# Patient Record
Sex: Male | Born: 2000 | Race: Black or African American | Hispanic: No | Marital: Single | Smoking: Never smoker
Health system: Southern US, Community
[De-identification: ages and names within clinical notes are randomized; demographics above are authoritative.]

## PROBLEM LIST (undated history)

## (undated) DIAGNOSIS — Q211 Atrial septal defect, unspecified: Secondary | ICD-10-CM

## (undated) DIAGNOSIS — J302 Other seasonal allergic rhinitis: Secondary | ICD-10-CM

## (undated) HISTORY — PX: EYE SURGERY: SHX253

## (undated) HISTORY — PX: CARDIAC SURGERY: SHX584

---
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
## 2022-01-02 DIAGNOSIS — M79641 Pain in right hand: Secondary | ICD-10-CM | POA: Diagnosis not present

## 2022-01-18 DIAGNOSIS — M79641 Pain in right hand: Secondary | ICD-10-CM | POA: Diagnosis not present

## 2022-02-13 DIAGNOSIS — M79641 Pain in right hand: Secondary | ICD-10-CM | POA: Diagnosis not present

## 2022-02-23 ENCOUNTER — Encounter: Payer: Self-pay | Admitting: Family Medicine

## 2022-02-23 ENCOUNTER — Ambulatory Visit (INDEPENDENT_AMBULATORY_CARE_PROVIDER_SITE_OTHER): Payer: Medicaid Other | Admitting: Family Medicine

## 2022-02-23 VITALS — BP 120/74 | HR 82 | Wt 154.0 lb

## 2022-02-23 DIAGNOSIS — G479 Sleep disorder, unspecified: Secondary | ICD-10-CM | POA: Diagnosis not present

## 2022-02-23 DIAGNOSIS — Z7689 Persons encountering health services in other specified circumstances: Secondary | ICD-10-CM | POA: Diagnosis not present

## 2022-02-23 NOTE — Progress Notes (Signed)
    SUBJECTIVE:   CHIEF COMPLAINT / HPI:   Patient same day walk in- states he has been having trouble falling asleep. Goes to sleep at 10:30pm and wakes up at 4am. Has issues falling asleep as well as going back to sleep once he wakes up. Typically wakes up at 12 in the afternoon and goes to bed at 10/11pm. Denies taking a nap. When he goes to sleep at 8pm then will wake up at 9pm. When he wakes up at 4am will get water and try to eat something but can't go back to sleep. Will look at his phone for 30 min. Has tried white noise and it doesn't help.   PERTINENT  PMH / PSH: Reviewed   OBJECTIVE:   BP 120/74   Pulse 82   Wt 154 lb (69.9 kg)   SpO2 96%   BMI 25.63 kg/m    Physical exam General: well appearing, NAD Cardiovascular: RRR, no murmurs Lungs: CTAB. Normal WOB Abdomen: soft, non-distended, non-tender Skin: warm, dry. No edema  ASSESSMENT/PLAN:   Sleep concern Patient presents to discuss difficulty falling asleep and staying asleep.  Typically goes to bed at 10:30 PM and will wake up at 4 AM.  States when he does wake up at 4 AM not to eat, look at phone, etc. in order to train body to fall right back asleep.  Discussed other sleep hygiene tips such as avoiding screen time prior to bedtime, not taking naps, avoiding caffeine after noon, etc. Recommended trying melatonin if needed.    [place]

## 2022-02-23 NOTE — Patient Instructions (Addendum)
It was great seeing you today!  We talked about your sleep and when you do wake up avoid using your phone, eating etc. To try and get your body back to sleep. Keep a log of when you are waking up early.   Avoid caffeine after 12 in the afternoon, avoid alcohol and screen use an hour before bed.   You can try melatonin over the counter as well for sleep.   Keep a log of the time you are going to bed and waking up.   Feel free to call with any questions or concerns at any time, at [phone].   Take care,  Dr. Shary [facility]    Healthy Living: Sleep Tips Sleep is an important part of a healthy lifestyle. In this video, you will learn about things you can do to help get quality sleep. To view the content, go to this web address: [email or web address]  This video will expire on: 09/21/2023. If you need access to this video following this date, please reach out to the healthcare provider who assigned it to you. This information is not intended to replace advice given to you by your health care provider. Make sure you discuss any questions you have with your health care provider. Elsevier Patient Education  [employer].

## 2022-02-24 DIAGNOSIS — Z7689 Persons encountering health services in other specified circumstances: Secondary | ICD-10-CM | POA: Insufficient documentation

## 2022-02-24 NOTE — Assessment & Plan Note (Signed)
Patient presents to discuss difficulty falling asleep and staying asleep.  Typically goes to bed at 10:30 PM and will wake up at 4 AM.  States when he does wake up at 4 AM not to eat, look at phone, etc. in order to train body to fall right back asleep.  Discussed other sleep hygiene tips such as avoiding screen time prior to bedtime, not taking naps, avoiding caffeine after noon, etc. Recommended trying melatonin if needed.

## 2022-03-09 ENCOUNTER — Other Ambulatory Visit: Payer: Self-pay | Admitting: Student

## 2022-03-31 DIAGNOSIS — G4489 Other headache syndrome: Secondary | ICD-10-CM | POA: Diagnosis not present

## 2022-03-31 DIAGNOSIS — R079 Chest pain, unspecified: Secondary | ICD-10-CM | POA: Diagnosis not present

## 2022-03-31 DIAGNOSIS — R0789 Other chest pain: Secondary | ICD-10-CM | POA: Diagnosis not present

## 2022-04-12 ENCOUNTER — Other Ambulatory Visit: Payer: Self-pay | Admitting: Student

## 2022-04-13 ENCOUNTER — Other Ambulatory Visit: Payer: Self-pay | Admitting: *Deleted

## 2022-04-13 DIAGNOSIS — L442 Lichen striatus: Secondary | ICD-10-CM

## 2022-04-13 MED ORDER — TRIAMCINOLONE ACETONIDE 0.5 % EX OINT: 1.0000 | TOPICAL_OINTMENT | Freq: Two times a day (BID) | CUTANEOUS | 3 refills | Status: AC

## 2022-06-06 ENCOUNTER — Ambulatory Visit (INDEPENDENT_AMBULATORY_CARE_PROVIDER_SITE_OTHER): Payer: 59 | Admitting: Family Medicine

## 2022-06-06 VITALS — BP 119/67 | HR 66 | Ht 65.0 in | Wt 150.5 lb

## 2022-06-06 DIAGNOSIS — Z23 Encounter for immunization: Secondary | ICD-10-CM | POA: Diagnosis not present

## 2022-06-06 DIAGNOSIS — R21 Rash and other nonspecific skin eruption: Secondary | ICD-10-CM

## 2022-06-06 NOTE — Patient Instructions (Signed)
It was great seeing you today!  Today we discussed your rash, this is due to lichen sclerosis which is thickening of the skin. This is not an infectious process so it would be appropriate for you to donate plasma.  I have completed the form as well describing this.   Please follow up at your next scheduled appointment, if anything arises between now and then, please don't hesitate to contact our office.   Thank you for allowing [name] to be a part of your medical care!  Thank you, Dr. Robyne Peers

## 2022-06-06 NOTE — Assessment & Plan Note (Signed)
-  has seen dermatology, s/p biopsy and noted to have lichen sclerosis. Has remained stable over the years. It is appropriate for patient to donate blood as this does not involve an infectious process. Patient is otherwise healthy.  -form completed to allow plasma donation -follow up as appropriate

## 2022-06-06 NOTE — Progress Notes (Signed)
    SUBJECTIVE:   CHIEF COMPLAINT / HPI:   Patient presents with concern of a rash that he has had for years. He is concerned that when he went to go donate plasma, they needed the doctor's permission prior to donating in the future. Denies fever or chills. He says that in the past he was referred to dermatology and they did a biopsy.  He reports that he uses the clobatesol as prescribed and the rash has not worsened or changed in years.   OBJECTIVE:   BP (!) 122/49   Pulse 66   Ht 5\' 5"  (1.651 m)   Wt 150 lb 8 oz (68.3 kg)   SpO2 97%   BMI 25.04 kg/m   General: Patient well-appearing, in no acute distress. Resp: normal work of breathing noted Derm: thickened, hyperpigmented area of the skin along the dorsal aspect of UE bilaterally without erythema or edema noted       ASSESSMENT/PLAN:   Rash -has seen dermatology, s/p biopsy and noted to have lichen sclerosis. Has remained stable over the years. It is appropriate for patient to donate blood as this does not involve an infectious process. Patient is otherwise healthy.  -form completed to allow plasma donation -follow up as appropriate      Merton Wadlow Robyne Peers, DO [facility]

## 2022-07-21 ENCOUNTER — Encounter (HOSPITAL_COMMUNITY): Payer: Self-pay | Admitting: Emergency Medicine

## 2022-07-21 ENCOUNTER — Ambulatory Visit (HOSPITAL_COMMUNITY)
Admission: EM | Admit: 2022-07-21 | Discharge: 2022-07-21 | Disposition: A | Discharge status: Home / Self Care | Payer: 59 | Attending: Internal Medicine | Admitting: Internal Medicine

## 2022-07-21 DIAGNOSIS — E860 Dehydration: Secondary | ICD-10-CM | POA: Diagnosis not present

## 2022-07-21 DIAGNOSIS — R252 Cramp and spasm: Secondary | ICD-10-CM | POA: Diagnosis not present

## 2022-07-21 MED ORDER — IBUPROFEN 800 MG PO TABS: 800.0000 mg | ORAL_TABLET | Freq: Three times a day (TID) | ORAL | 0 refills | Status: DC

## 2022-07-21 MED ORDER — IBUPROFEN 800 MG PO TABS
800.0000 mg | ORAL_TABLET | Freq: Once | ORAL | Status: AC
  Administered 2022-07-21: 800 mg via ORAL

## 2022-07-21 MED ORDER — IBUPROFEN 800 MG PO TABS
ORAL_TABLET | ORAL | Status: AC
  Filled 2022-07-21: qty 1

## 2022-07-21 NOTE — Discharge Instructions (Signed)
Your leg cramping is likely due to dehydration.  Please increase her water intake to at least 6 to 8 cups of water per day (64 ounces of water per day).  Ibuprofen as needed for pain.  If you develop any new or worsening symptoms or do not improve in the next 2 to 3 days, please return.  If your symptoms are severe, please go to the emergency room.  Follow-up with your primary care provider for further evaluation and management of your symptoms as well as ongoing wellness visits.  I hope you feel better!

## 2022-07-21 NOTE — ED Triage Notes (Signed)
Pt reports that he was walking down town yesterday and got bilateral leg cramps. Reports that "next thing I know I am being arrested and the police pushed me on the ground". Pt c/o bilateral leg pains but worse on left leg.
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
## 2022-09-02 ENCOUNTER — Emergency Department (HOSPITAL_COMMUNITY): Payer: 59

## 2022-09-02 ENCOUNTER — Emergency Department (HOSPITAL_COMMUNITY)
Admission: EM | Admit: 2022-09-02 | Discharge: 2022-09-02 | Disposition: A | Discharge status: Home / Self Care | Payer: 59 | Attending: Emergency Medicine | Admitting: Emergency Medicine

## 2022-09-02 ENCOUNTER — Other Ambulatory Visit: Payer: Self-pay

## 2022-09-02 ENCOUNTER — Encounter (HOSPITAL_COMMUNITY): Payer: Self-pay

## 2022-09-02 DIAGNOSIS — R0789 Other chest pain: Secondary | ICD-10-CM | POA: Diagnosis not present

## 2022-09-02 DIAGNOSIS — I10 Essential (primary) hypertension: Secondary | ICD-10-CM | POA: Diagnosis not present

## 2022-09-02 DIAGNOSIS — R0781 Pleurodynia: Secondary | ICD-10-CM | POA: Insufficient documentation

## 2022-09-02 DIAGNOSIS — R079 Chest pain, unspecified: Secondary | ICD-10-CM | POA: Diagnosis not present

## 2022-09-02 LAB — CBC
HCT: 51.4 % (ref 39.0–52.0)
Hemoglobin: 17.2 g/dL — ABNORMAL HIGH (ref 13.0–17.0)
MCH: 31.3 pg (ref 26.0–34.0)
MCHC: 33.5 g/dL (ref 30.0–36.0)
MCV: 93.6 fL (ref 80.0–100.0)
Platelets: 344 10*3/uL (ref 150–400)
RBC: 5.49 MIL/uL (ref 4.22–5.81)
RDW: 12.7 % (ref 11.5–15.5)
WBC: 12.9 10*3/uL — ABNORMAL HIGH (ref 4.0–10.5)
nRBC: 0 % (ref 0.0–0.2)

## 2022-09-02 LAB — BASIC METABOLIC PANEL
Anion gap: 6 (ref 5–15)
BUN: 13 mg/dL (ref 6–20)
CO2: 28 mmol/L (ref 22–32)
Calcium: 8.5 mg/dL — ABNORMAL LOW (ref 8.9–10.3)
Chloride: 105 mmol/L (ref 98–111)
Creatinine, Ser: 1.46 mg/dL — ABNORMAL HIGH (ref 0.61–1.24)
GFR, Estimated: >60 mL/min (ref >60)
Glucose, Bld: 81 mg/dL (ref 70–99)
Potassium: 4 mmol/L (ref 3.5–5.1)
Sodium: 139 mmol/L (ref 135–145)

## 2022-09-02 LAB — TROPONIN I (HIGH SENSITIVITY)
Troponin I (High Sensitivity): 6 ng/L (ref <18)
Troponin I (High Sensitivity): 5 ng/L (ref <18)

## 2022-09-02 MED ORDER — METHOCARBAMOL 500 MG PO TABS
1000.0000 mg | ORAL_TABLET | Freq: Once | ORAL | Status: AC
  Administered 2022-09-02: 1000 mg via ORAL
  Filled 2022-09-02: qty 2

## 2022-09-02 MED ORDER — CYCLOBENZAPRINE HCL 10 MG PO TABS: 10.0000 mg | ORAL_TABLET | Freq: Two times a day (BID) | ORAL | 0 refills | Status: DC | PRN

## 2022-09-02 NOTE — ED Provider Notes (Signed)
[facility] Provider Note   CSN: [id number] Arrival date & time: 09/02/22  0243     History  Chief Complaint  Patient presents with   Chest Pain    Edwin Macdonald is a 22 y.o. male.  HPI   Patient with medical history including ASD status post patching, presenting with complaints of sternal chest pain, states it woke him up around 1 AM, states it is constant, describes a pressure-like sensation, he has no associated shortness of breath, pleuritic chest pain, does not radiate, there is no alleviating or aggravating factors, denies any stomach pain, denies any recent surgeries long immobilizations, no history of PEs or DVTs.  Patient states he has had this before but resolved on its own.    Home Medications Prior to Admission medications   Medication Sig Start Date End Date Taking? Authorizing Provider  cyclobenzaprine (FLEXERIL) 10 MG tablet Take 1 tablet (10 mg total) by mouth 2 (two) times daily as needed for muscle spasms. 09/02/22  Yes Carroll Sage, PA-C  cetirizine (ZYRTEC) 10 MG tablet TAKE 1 TABLET(10 MG) BY MOUTH DAILY 04/13/22   Idalia Needle, Turkey J, DO  famotidine (PEPCID) 20 MG tablet TAKE 1 TABLET BY MOUTH 2 TIMES A DAY 08/04/19   Dana Allan, MD  fluticasone [facility]) 50 MCG/ACT nasal spray SHAKE LIQUID AND USE 1 SPRAY IN EACH NOSTRIL DAILY 04/13/22   Idalia Needle, Lucas Mallow, DO  ibuprofen (ADVIL) 800 MG tablet Take 1 tablet (800 mg total) by mouth 3 (three) times daily. 07/21/22   Carlisle Beers, FNP  montelukast (SINGULAIR) 10 MG tablet TAKE 1 TABLET(10 MG) BY MOUTH AT BEDTIME 03/10/22   Maury Dus, MD  ranitidine (ZANTAC) 150 MG capsule Take 1 capsule (150 mg total) by mouth daily. 02/16/16   Niel Hummer, MD  triamcinolone ointment (KENALOG) 0.5 % Apply 1 Application topically 2 (two) times daily. For moderate to severe eczema.  Do not use for more than 1 week at a time. 04/13/22   Cora Collum, DO      Allergies     Patient has no known allergies.    Review of Systems   Review of Systems  Constitutional:  Negative for chills and fever.  Respiratory:  Negative for shortness of breath.   Cardiovascular:  Positive for chest pain.  Gastrointestinal:  Negative for abdominal pain.  Neurological:  Negative for headaches.    Physical Exam Updated Vital Signs BP 113/76   Pulse (!) 58   Temp 98 F (36.7 C) (Oral)   Resp 18   Ht 5\' 9"  (1.753 m)   Wt 68 kg   SpO2 99%   BMI 22.15 kg/m  Physical Exam Vitals and nursing note reviewed.  Constitutional:      General: He is not in acute distress.    Appearance: He is not ill-appearing.  HENT:     Head: Normocephalic and atraumatic.     Nose: No congestion.  Eyes:     Conjunctiva/sclera: Conjunctivae normal.  Cardiovascular:     Rate and Rhythm: Normal rate and regular rhythm.     Pulses: Normal pulses.     Heart sounds: No murmur heard.    No friction rub. No gallop.  Pulmonary:     Effort: No respiratory distress.     Breath sounds: No wheezing, rhonchi or rales.     Comments:  notable surgical scar along patient's sternum, chest was palpated he has focalized and reproducible chest pain along  his sternum, lung sounds are clear bilaterally. Abdominal:     Palpations: Abdomen is soft.     Tenderness: There is no abdominal tenderness. There is no right CVA tenderness or left CVA tenderness.  Musculoskeletal:     Right lower leg: No edema.     Left lower leg: No edema.     Comments: No unilateral leg swelling no calf tenderness no palpable cords.  Skin:    General: Skin is warm and dry.  Neurological:     Mental Status: He is alert.  Psychiatric:        Mood and Affect: Mood normal.     ED Results / Procedures / Treatments   Labs (all labs ordered are listed, but only abnormal results are displayed) Labs Reviewed  BASIC METABOLIC PANEL - Abnormal; Notable for the following components:      Result Value   Creatinine, Ser 1.46 (*)     Calcium 8.5 (*)    All other components within normal limits  CBC - Abnormal; Notable for the following components:   WBC 12.9 (*)    Hemoglobin 17.2 (*)    All other components within normal limits  TROPONIN I (HIGH SENSITIVITY)  TROPONIN I (HIGH SENSITIVITY)    EKG EKG Interpretation Date/Time:  Saturday September 02 2022 02:56:38 EDT Ventricular Rate:  72 PR Interval:  141 QRS Duration:  77 QT Interval:  350 QTC Calculation: 383 R Axis:   102  Text Interpretation: Sinus rhythm Consider right ventricular hypertrophy When compared with ECG of 01/18/2021, No significant change was found Confirmed by Dione Booze ([id number]) on 09/02/2022 3:05:38 AM  Radiology DG Chest 2 View  Result Date: 09/02/2022 CLINICAL DATA:  Chest pain EXAM: CHEST - 2 VIEW COMPARISON:  Radiograph 01/18/2021 FINDINGS: Sternotomy. The heart size and mediastinal contours are within normal limits. Both lungs are clear. The visualized skeletal structures are unremarkable. IMPRESSION: No active cardiopulmonary disease. Electronically Signed   By: Minerva Fester M.D.   On: 09/02/2022 03:25    Procedures Procedures    Medications Ordered in ED Medications  methocarbamol (ROBAXIN) tablet 1,000 mg (1,000 mg Oral Given 09/02/22 0536)    ED Course/ Medical Decision Making/ A&P                                 Medical Decision Making Amount and/or Complexity of Data Reviewed Labs: ordered. Radiology: ordered.  Risk Prescription drug management.   This patient presents to the ED for concern of chest pain, this involves an extensive number of treatment options, and is a complaint that carries with it a high risk of complications and morbidity.  The differential diagnosis includes PE, ACS, pneumonia, dissection,    Additional history obtained:  Additional history obtained from N/A External records from outside source obtained and reviewed including etiology notes   Co morbidities that complicate the patient  evaluation  ASD  Social Determinants of Health:  N/A    Lab Tests:  I Ordered, and personally interpreted labs.  The pertinent results include: CBC shows leukocytosis 12.9, BMP reveals creatinine 1.46, calcium 8.5, negative delta troponin   Imaging Studies ordered:  I ordered imaging studies including chest x-ray I independently visualized and interpreted imaging which showed unremarkable I agree with the radiologist interpretation   Cardiac Monitoring:  The patient was maintained on a cardiac monitor.  I personally viewed and interpreted the cardiac monitored which showed an underlying rhythm  of: EKG that signs of ischemia   Medicines ordered and prescription drug management:  I ordered medication including Robaxin I have reviewed the patients home medicines and have made adjustments as needed  Critical Interventions:  N/A  Reevaluation:  Presents with chest pain will obtain chest pain workup, continue to monitor  Updated on lab workup and imaging, he is in agreement with discharge at this time.  Consultations Obtained:  N/a    Test Considered:  N/a    Rule out I have low suspicion for ACS as history is atypical, EKG was sinus rhythm without signs of ischemia, patient had a negative troponin.  Low suspicion for PE as patient denies pleuritic chest pain, shortness of breath, patient denies leg pain, no pedal edema noted on exam, patient was PERC negative.  Low suspicion for AAA or aortic dissection as history is atypical, patient has low risk factors.  Low suspicion for systemic infection as patient is nontoxic-appearing, vital signs reassuring, no obvious source infection noted on exam.     Dispostion and problem list  After consideration of the diagnostic results and the patients response to treatment, I feel that the patent would benefit from discharge.  Sternal chest pain-suspect this is muscular in nature, will provide muscle relaxers, recommend  over-the-counter pain medications, follow-up with cardiology as needed.            Final Clinical Impression(s) / ED Diagnoses Final diagnoses:  Chest pain, mid sternal    Rx / DC Orders ED Discharge Orders          Ordered    cyclobenzaprine (FLEXERIL) 10 MG tablet  2 times daily PRN        09/02/22 6578    Ambulatory referral to Cardiology       Comments: If you have not heard from the Cardiology office within the next 72 hours please call [phone].   09/02/22 0638              Carroll Sage, PA-C 09/02/22 1324    Dione Booze, MD 09/02/22 [phone]

## 2022-09-02 NOTE — Discharge Instructions (Addendum)
Lab workup and imaging are reassuring, recommend over-the-counter pain medications.  Given you a muscle laxer take as prescribed.  May follow-up with cardiology and/or PCP.  Come back to the emergency department if you develop chest pain, shortness of breath, severe abdominal pain, uncontrolled nausea, vomiting, diarrhea.
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
## 2022-09-12 ENCOUNTER — Ambulatory Visit: Payer: 59 | Admitting: Family Medicine

## 2022-11-20 ENCOUNTER — Ambulatory Visit (INDEPENDENT_AMBULATORY_CARE_PROVIDER_SITE_OTHER): Payer: 59 | Admitting: Family Medicine

## 2022-11-20 ENCOUNTER — Encounter: Payer: Self-pay | Admitting: Family Medicine

## 2022-11-20 VITALS — BP 110/60 | HR 65 | Ht 65.0 in | Wt 148.5 lb

## 2022-11-20 DIAGNOSIS — D751 Secondary polycythemia: Secondary | ICD-10-CM

## 2022-11-20 DIAGNOSIS — R7989 Other specified abnormal findings of blood chemistry: Secondary | ICD-10-CM | POA: Diagnosis not present

## 2022-11-20 DIAGNOSIS — Z114 Encounter for screening for human immunodeficiency virus [HIV]: Secondary | ICD-10-CM | POA: Diagnosis not present

## 2022-11-20 DIAGNOSIS — J302 Other seasonal allergic rhinitis: Secondary | ICD-10-CM

## 2022-11-20 DIAGNOSIS — Z1159 Encounter for screening for other viral diseases: Secondary | ICD-10-CM | POA: Diagnosis not present

## 2022-11-20 NOTE — Assessment & Plan Note (Signed)
Home medications include zyrtec, Singulair and flonase. Does not need any refills at this time.  -Follow up as needed

## 2022-11-20 NOTE — Assessment & Plan Note (Signed)
Patient was seen in ED recently for chest pain which was MSK in nature. Incidental elevation in Creatinine noted.  - BMP scheduled for f/u Cr

## 2022-11-20 NOTE — Patient Instructions (Addendum)
It was wonderful to see you today.  Please bring ALL of your medications with you to every visit.   Today we talked about:  Your blood pressure. Your Blood pressure looks great today! If you start to experience symptoms and would like to have it checked again, please schedule a visit. Please keep a log of your blood pressure if you feel sick or feel it is high and bring it to your next visit.   We also ordered some labs for you. Please attend your lab visit on 11/7 at 2 PM. Please come to the front desk and let them know you are there for a lab visit.   Thank you for choosing [facility].   Please call [phone] with any questions about today's appointment.  Please arrive at least 15 minutes prior to your scheduled appointments.   If you had blood work today, I will send you a MyChart message or a letter if results are normal. Otherwise, I will give you a call.   If you had a referral placed, they will call you to set up an appointment. Please give [name] a call if you don't hear back in the next 2 weeks.   If you need additional refills before your next appointment, please call your pharmacy first.   Hal Morales, MD Family Medicine

## 2022-11-20 NOTE — Progress Notes (Signed)
    SUBJECTIVE:   CHIEF COMPLAINT / HPI:  Patient has no current concerns. States he has no known past medical history. On chart review, it seems he has a history of ASD s/p patch closure in 2010. He reports he has seasonal allergies for which he takes allergy medicine but otherwise takes no medication and has no concerns.   He lives with his mom and younger sister. He has graduated from high school and has had a few jobs but not one right now. Previously he worked at [employer]. He wants to go back to school to get his CDL (commercial driving license) and be a Naval architect. He likes to work out and plays video games for fun. He denies any drug use. He has used marijuana in the past but says he quit 4 months ago. He denies alcohol or tobacco use. He is not sexually active.   PERTINENT  PMH / PSH:  - ASD s/p patch repair 2010 OBJECTIVE:   BP 110/60   Pulse 65   Ht 5\' 5"  (1.651 m)   Wt 148 lb 8 oz (67.4 kg)   SpO2 98%   BMI 24.71 kg/m   General: A&O, NAD HEENT: No sign of trauma, EOM grossly intact Cardiac: RRR, no m/r/g Respiratory: CTAB, normal WOB, no w/c/r GI: Soft, NTTP, non-distended  Extremities: NTTP, no peripheral edema. Neuro: Normal gait, moves all four extremities appropriately. Psych: Appropriate mood and affect  ASSESSMENT/PLAN:   Elevated serum creatinine Patient was seen in ED recently for chest pain which was MSK in nature. Incidental elevation in Creatinine noted.  - BMP scheduled for f/u Cr   Seasonal allergies Home medications include zyrtec, Singulair and flonase. Does not need any refills at this time.  -Follow up as needed   Elevated Hg Patient found to have slightly elevated Hg at ER visit. May be incidental finding, will follow up. - CBC ordered   Healthcare Maintenance - HIV and Hep C screening ordered   Edwin Morales, MD [facility]

## 2022-11-23 ENCOUNTER — Other Ambulatory Visit: Payer: 59

## 2022-11-23 DIAGNOSIS — D751 Secondary polycythemia: Secondary | ICD-10-CM

## 2022-11-23 DIAGNOSIS — R7989 Other specified abnormal findings of blood chemistry: Secondary | ICD-10-CM

## 2022-11-24 LAB — CBC
Hematocrit: 47.8 % (ref 37.5–51.0)
Hemoglobin: 15.8 g/dL (ref 13.0–17.7)
MCH: 31.4 pg (ref 26.6–33.0)
MCHC: 33.1 g/dL (ref 31.5–35.7)
MCV: 95 fL (ref 79–97)
Platelets: 350 10*3/uL (ref 150–450)
RBC: 5.03 x10E6/uL (ref 4.14–5.80)
RDW: 11.8 % (ref 11.6–15.4)
WBC: 10.4 10*3/uL (ref 3.4–10.8)

## 2022-11-24 LAB — BASIC METABOLIC PANEL
BUN/Creatinine Ratio: 11 (ref 9–20)
BUN: 14 mg/dL (ref 6–20)
CO2: 25 mmol/L (ref 20–29)
Calcium: 9.2 mg/dL (ref 8.7–10.2)
Chloride: 105 mmol/L (ref 96–106)
Creatinine, Ser: 1.25 mg/dL (ref 0.76–1.27)
Glucose: 64 mg/dL — ABNORMAL LOW (ref 70–99)
Potassium: 4.6 mmol/L (ref 3.5–5.2)
Sodium: 143 mmol/L (ref 134–144)
eGFR: 85 mL/min/{1.73_m2} (ref >59)

## 2022-11-27 ENCOUNTER — Encounter: Payer: Self-pay | Admitting: Family Medicine

## 2023-01-15 ENCOUNTER — Encounter (HOSPITAL_COMMUNITY): Payer: Self-pay | Admitting: *Deleted

## 2023-01-15 ENCOUNTER — Other Ambulatory Visit: Payer: Self-pay

## 2023-01-15 ENCOUNTER — Emergency Department (HOSPITAL_COMMUNITY)
Admission: EM | Admit: 2023-01-15 | Discharge: 2023-01-15 | Disposition: A | Discharge status: Home / Self Care | Payer: 59 | Attending: Emergency Medicine | Admitting: Emergency Medicine

## 2023-01-15 ENCOUNTER — Emergency Department (HOSPITAL_COMMUNITY): Payer: 59

## 2023-01-15 DIAGNOSIS — S52502A Unspecified fracture of the lower end of left radius, initial encounter for closed fracture: Secondary | ICD-10-CM | POA: Diagnosis not present

## 2023-01-15 DIAGNOSIS — S52592A Other fractures of lower end of left radius, initial encounter for closed fracture: Secondary | ICD-10-CM

## 2023-01-15 DIAGNOSIS — S90112A Contusion of left great toe without damage to nail, initial encounter: Secondary | ICD-10-CM | POA: Insufficient documentation

## 2023-01-15 DIAGNOSIS — S50312A Abrasion of left elbow, initial encounter: Secondary | ICD-10-CM | POA: Insufficient documentation

## 2023-01-15 DIAGNOSIS — M79675 Pain in left toe(s): Secondary | ICD-10-CM | POA: Diagnosis not present

## 2023-01-15 DIAGNOSIS — Y92410 Unspecified street and highway as the place of occurrence of the external cause: Secondary | ICD-10-CM | POA: Diagnosis not present

## 2023-01-15 DIAGNOSIS — Z041 Encounter for examination and observation following transport accident: Secondary | ICD-10-CM | POA: Diagnosis not present

## 2023-01-15 DIAGNOSIS — S6992XA Unspecified injury of left wrist, hand and finger(s), initial encounter: Secondary | ICD-10-CM | POA: Diagnosis present

## 2023-01-15 MED ORDER — IBUPROFEN 400 MG PO TABS
600.0000 mg | ORAL_TABLET | Freq: Once | ORAL | Status: AC
  Administered 2023-01-15: 600 mg via ORAL
  Filled 2023-01-15: qty 1

## 2023-01-15 MED ORDER — IBUPROFEN 600 MG PO TABS: 600.0000 mg | ORAL_TABLET | Freq: Four times a day (QID) | ORAL | 0 refills | Status: DC | PRN

## 2023-01-15 MED ORDER — FENTANYL CITRATE PF 50 MCG/ML IJ SOSY
50.0000 ug | PREFILLED_SYRINGE | Freq: Once | INTRAMUSCULAR | Status: AC
  Administered 2023-01-15: 50 ug via INTRAVENOUS
  Filled 2023-01-15: qty 1
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
## 2023-01-15 NOTE — Progress Notes (Signed)
Orthopedic Tech Progress Note Patient Details:  Edwin Macdonald 05/09/2000 [id number]  Ortho Devices Type of Ortho Device: Arm sling, Sugartong splint Ortho Device/Splint Location: lue Ortho Device/Splint Interventions: Ordered, Adjustment, Application   Post Interventions Patient Tolerated: Well Instructions Provided: Care of device, Adjustment of device  [name] 01/15/2023, 4:13 AM
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
## 2023-01-26 ENCOUNTER — Telehealth: Payer: Self-pay

## 2023-01-26 NOTE — Progress Notes (Signed)
 Transition Care Management Unsuccessful Follow-up Telephone Call  Date of discharge and from where:  Edwin Macdonald 12/30  Attempts:  1st Attempt  Reason for unsuccessful TCM follow-up call:  No answer/busy   [name] Levelock  [facility], [facility] Guide, Phone: [phone] Website: [email or web address]

## 2023-01-29 ENCOUNTER — Telehealth: Payer: Self-pay

## 2023-01-29 NOTE — Progress Notes (Signed)
 Transition Care Management Unsuccessful Follow-up Telephone Call  Date of discharge and from where:  Edwin Macdonald 12/30  Attempts:  2nd Attempt  Reason for unsuccessful TCM follow-up call:  No answer/busy   [name]  [facility], [facility] Guide, Phone: [phone] Website: [email or web address]

## 2023-01-30 DIAGNOSIS — S52502D Unspecified fracture of the lower end of left radius, subsequent encounter for closed fracture with routine healing: Secondary | ICD-10-CM | POA: Diagnosis not present

## 2023-03-15 ENCOUNTER — Other Ambulatory Visit: Payer: Self-pay | Admitting: Family Medicine

## 2023-04-17 ENCOUNTER — Ambulatory Visit (INDEPENDENT_AMBULATORY_CARE_PROVIDER_SITE_OTHER): Admitting: Student

## 2023-04-17 VITALS — BP 107/61 | HR 60 | Temp 98.6°F | Ht 65.0 in | Wt 143.1 lb

## 2023-04-17 DIAGNOSIS — H9202 Otalgia, left ear: Secondary | ICD-10-CM | POA: Diagnosis not present

## 2023-04-17 MED ORDER — FLUTICASONE PROPIONATE 50 MCG/ACT NA SUSP: 2.0000 | Freq: Every day | NASAL | 6 refills | Status: AC

## 2023-04-17 MED ORDER — DEBROX 6.5 % OT SOLN: 5.0000 [drp] | Freq: Two times a day (BID) | OTIC | 0 refills | Status: AC

## 2023-04-17 NOTE — Progress Notes (Signed)
    SUBJECTIVE:   CHIEF COMPLAINT / HPI:   Ear Pain:  - Left ear x 2 days  - No drainage  - Slight headache with ringing  - still able to hear - no systemic symptoms  - no chestp ain or shortness of breath - no trauma or water in ear - Never happened before   PERTINENT  PMH / PSH:  ASD H/o angioedema Depression Keloid  Allergies  Speech delay   OBJECTIVE:   BP 107/61   Pulse 60   Temp 98.6 F (37 C) (Oral)   Ht 5\' 5"  (1.651 m)   Wt 143 lb 2 oz (64.9 kg)   SpO2 100%   BMI 23.82 kg/m   General: Alert and oriented in no apparent distress HEENT: Cerumen impaction in both ears, unable to visualize tympanic membranes. No drainage from left ear. No maxillary sinus tenderness. Heart: Regular rate and rhythm with no murmurs appreciated Lungs: Normal wob Abdomen: no abdominal pain Skin: Warm and dry    ASSESSMENT/PLAN:   Assessment & Plan Left ear pain Presents with a two-day history of constant left ear pain and tinnitus. No history of trauma, swimming, nasal congestion, or otorrhea. Denies dizziness, fever, or chills. Examination limited by significant cerumen obstructing the tympanic membrane view. Differential includes cerumen impaction, Eustachian tube dysfunction, or less likely, otitis media. Fluticasone nasal spray may be beneficial if allergy symptoms are present, as seasonal allergies could contribute to symptoms. - Prescribe carbamide peroxide (Debrox) ear drops to soften and facilitate removal of cerumen. - Advise follow-up visit after cerumen removal to reassess the ear. - Instruct to report any fever, chills, or worsening pain. - Recommend ibuprofen for analgesia. - Consider prescribing fluticasone nasal spray if allergy symptoms are suspected. - Consider ear flush in 1 week upon return  - return precautions provided     Alfredo Martinez, MD [facility]

## 2023-04-17 NOTE — Patient Instructions (Signed)
 It was great to see you today! Thank you for choosing [facility] for your primary care.  Today we addressed: Use debrox twice a day for ear wax, do not use cotton swabs.  Return in 1 week to reassess and possibly to have ears flushed  Flonase 2 sprays in each nostril daily   If you haven't already, sign up for My Chart to have easy access to your labs results, and communication with your primary care physician.  Return in 1 week  Please arrive 15 minutes before your appointment to ensure smooth check in process.  We appreciate your efforts in making this happen.  Thank you for allowing me to participate in your care, Alfredo Martinez, MD 04/17/2023, 10:55 AM PGY-3, [facility]

## 2023-06-01 ENCOUNTER — Encounter (INDEPENDENT_AMBULATORY_CARE_PROVIDER_SITE_OTHER): Admitting: Family Medicine

## 2023-06-01 NOTE — Progress Notes (Signed)
 Was told had "low protein" when donating plasma. Reviewed labs, no abnormal findings. Patient completely asymptomatic. Discussed rescheduling visit with more information including blood work from plasma donation site. Patient expressed understanding. NO charge visit

## 2023-06-19 ENCOUNTER — Emergency Department (HOSPITAL_COMMUNITY)

## 2023-06-19 ENCOUNTER — Other Ambulatory Visit: Payer: Self-pay

## 2023-06-19 ENCOUNTER — Encounter (HOSPITAL_COMMUNITY): Payer: Self-pay

## 2023-06-19 ENCOUNTER — Emergency Department (HOSPITAL_COMMUNITY)
Admission: EM | Admit: 2023-06-19 | Discharge: 2023-06-19 | Disposition: A | Discharge status: Home / Self Care | Attending: Emergency Medicine | Admitting: Emergency Medicine

## 2023-06-19 DIAGNOSIS — R079 Chest pain, unspecified: Secondary | ICD-10-CM | POA: Diagnosis not present

## 2023-06-19 DIAGNOSIS — R0602 Shortness of breath: Secondary | ICD-10-CM | POA: Diagnosis not present

## 2023-06-19 DIAGNOSIS — R059 Cough, unspecified: Secondary | ICD-10-CM | POA: Diagnosis not present

## 2023-06-19 DIAGNOSIS — J069 Acute upper respiratory infection, unspecified: Secondary | ICD-10-CM | POA: Diagnosis not present

## 2023-06-19 DIAGNOSIS — B9789 Other viral agents as the cause of diseases classified elsewhere: Secondary | ICD-10-CM | POA: Diagnosis not present

## 2023-06-19 DIAGNOSIS — R0789 Other chest pain: Secondary | ICD-10-CM | POA: Diagnosis not present

## 2023-06-19 DIAGNOSIS — R918 Other nonspecific abnormal finding of lung field: Secondary | ICD-10-CM | POA: Diagnosis not present

## 2023-06-19 LAB — RESP PANEL BY RT-PCR (RSV, FLU A&B, COVID)  RVPGX2
Influenza A by PCR: NEGATIVE
Influenza B by PCR: NEGATIVE
Resp Syncytial Virus by PCR: NEGATIVE
SARS Coronavirus 2 by RT PCR: NEGATIVE

## 2023-06-19 MED ORDER — AZITHROMYCIN 250 MG PO TABS
250.0000 mg | ORAL_TABLET | Freq: Every day | ORAL | 0 refills | Status: DC
Start: 2023-06-19 — End: 2023-09-17

## 2023-06-19 MED ORDER — AZITHROMYCIN 250 MG PO TABS
250.0000 mg | ORAL_TABLET | Freq: Every day | ORAL | 0 refills | Status: DC
Start: 2023-06-19 — End: 2023-06-19

## 2023-06-19 MED ORDER — AZITHROMYCIN 250 MG PO TABS: 500.0000 mg | ORAL_TABLET | Freq: Once | ORAL | Status: DC
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
## 2023-06-19 NOTE — Discharge Instructions (Addendum)
 As we discussed, you can continue to take Tylenol  or ibuprofen  for pain, and I will place a prescription for Tessalon to help you with your cough over the next few days.  Viral versus upper respiratory infections tend to last for 7 to 10 days, please drink extra fluids, and follow-up with your primary care for continued treatment.

## 2023-06-19 NOTE — ED Provider Notes (Signed)
 [place] EMERGENCY DEPARTMENT AT [facility] Provider Note   CSN: [id number] Arrival date & time: 06/19/23  1044     History  Chief Complaint  Patient presents with   Cough   chest congestion    Edwin Macdonald is a 23 y.o. male who presents today for chest discomfort accompanied with cough that has been going on for the last several hours.  He does not endorse any dyspnea however states that he has had some purulent sputum that is produced with a forceful cough, denies hemoptysis.  There is no radiation of the pain, and he states that his chest wall and upper abdomen are tender to palpation.  Of note, this patient does have a previous medical history of angioedema, seasonal allergies.     Cough Associated symptoms: no shortness of breath and no wheezing        Home Medications Prior to Admission medications   Medication Sig Start Date End Date Taking? Authorizing Provider  cetirizine  (ZYRTEC ) 10 MG tablet TAKE 1 TABLET(10 MG) BY MOUTH DAILY 04/13/22   Paige, Victoria J, DO  fluticasone  (FLONASE ) 50 MCG/ACT nasal spray Place 2 sprays into both nostrils daily. 04/17/23   Ernestina Headland, MD  ibuprofen  (ADVIL ) 600 MG tablet Take 1 tablet (600 mg total) by mouth every 6 (six) hours as needed. 01/15/23   Kelsey Patricia, MD  montelukast  (SINGULAIR ) 10 MG tablet TAKE 1 TABLET(10 MG) BY MOUTH AT BEDTIME 03/15/23   Baloch, Mahnoor, MD  triamcinolone  ointment (KENALOG ) 0.5 % Apply 1 Application topically 2 (two) times daily. For moderate to severe eczema.  Do not use for more than 1 week at a time. 04/13/22   Paige, Victoria J, DO      Allergies    Patient has no known allergies.    Review of Systems   Review of Systems  Respiratory:  Positive for cough and chest tightness. Negative for shortness of breath and wheezing.   Cardiovascular:  Negative for palpitations and leg swelling.    Physical Exam Updated Vital Signs BP 123/67 (BP Location: Right Arm)   Pulse (!) 103    Temp 98.6 F (37 C) (Oral)   Resp 18   Ht 5\' 9"  (1.753 m)   Wt 68 kg   SpO2 99%   BMI 22.15 kg/m  Physical Exam Vitals and nursing note reviewed.  Constitutional:      General: He is not in acute distress.    Appearance: Normal appearance.  HENT:     Head: Normocephalic and atraumatic.     Right Ear: Tympanic membrane and ear canal normal.     Left Ear: Tympanic membrane and ear canal normal.     Nose: Congestion present.     Mouth/Throat:     Mouth: Mucous membranes are moist.     Pharynx: Oropharynx is clear. Postnasal drip present. No pharyngeal swelling, oropharyngeal exudate, posterior oropharyngeal erythema or uvula swelling.     Tonsils: No tonsillar exudate or tonsillar abscesses.  Eyes:     Extraocular Movements: Extraocular movements intact.     Conjunctiva/sclera: Conjunctivae normal.     Pupils: Pupils are equal, round, and reactive to light.  Cardiovascular:     Rate and Rhythm: Normal rate and regular rhythm.     Pulses: Normal pulses.     Heart sounds: Normal heart sounds. No murmur heard.    No friction rub. No gallop.  Pulmonary:     Effort: Pulmonary effort is normal.  Breath sounds: Normal breath sounds and air entry. No wheezing, rhonchi or rales.  Abdominal:     General: Abdomen is flat. Bowel sounds are normal.     Palpations: Abdomen is soft.  Musculoskeletal:        General: Normal range of motion.     Cervical back: Normal range of motion and neck supple.     Right lower leg: No edema.     Left lower leg: No edema.  Skin:    General: Skin is warm and dry.     Capillary Refill: Capillary refill takes less than 2 seconds.  Neurological:     General: No focal deficit present.     Mental Status: He is alert. Mental status is at baseline.  Psychiatric:        Mood and Affect: Mood normal.     ED Results / Procedures / Treatments   Labs (all labs ordered are listed, but only abnormal results are displayed) Labs Reviewed  RESP PANEL BY  RT-PCR (RSV, FLU A&B, COVID)  RVPGX2    EKG None  Radiology No results found.  Procedures Procedures    Medications Ordered in ED Medications - No data to display  ED Course/ Medical Decision Making/ A&P                                 Medical Decision Making Amount and/or Complexity of Data Reviewed Radiology: ordered.   Medical Decision Making:   Edwin Macdonald is a 23 y.o. male who presented to the ED today with cough and associated chest discomfort detailed above.     Complete initial physical exam performed, notably the patient  was alert and oriented in no apparent distress.  He is breathing normally without any shortness of breath.  Pulmonary auscultation is unremarkable with no adventitious lung sounds auscultated.  He has no cervical lymphadenopathy, posterior oropharynx is clear and unremarkable except for noted postnasal drip.     Reviewed and confirmed nursing documentation for past medical history, family history, social history.    Initial Assessment:   With the patient's presentation of cough with associated chest discomfort, most likely diagnosis is viral URI. Other diagnoses were considered including (but not limited to) ACS, pneumonia, pulmonary embolus. These are considered less likely due to history of present illness and physical exam findings.     Initial Plan:  Flu/COVID/RSV swabs to evaluate for viral etiology. CXR to evaluate for structural/infectious intrathoracic pathology.  EKG to evaluate for cardiac pathology Objective evaluation as below reviewed   Initial Study Results:   Laboratory  All laboratory results reviewed without evidence of clinically relevant pathology.   Exceptions include: None  EKG EKG was reviewed independently. Rate, rhythm, axis, intervals all examined and without medically relevant abnormality. ST segments without concerns for elevations.    Radiology:  All images reviewed independently. Agree with radiology report at  this time.   No results found.   Reassessment and Plan:   Based on physical exam along with workup performed, do not believe this patient has cardiopulmonary disease at this time.  X-ray imaging does show a patchy lower lobe infiltrate consistent with potential pneumonia ,lung sounds are clear and equal without any adventitious lung sounds.  EKG on this patient is unremarkable.  With coughing up with purulent sputum, as well as his with negative flu/COVID/RSV, relatively short onset.  Has x-ray findings are consistent with potential pneumonia, will begin  treatment with oral azithromycin, as well as having the patient take over-the-counter analgesia such as NSAIDs for any continuing pain.  Suggest follow-up with primary care in 7 days to reevaluate progression.          Final Clinical Impression(s) / ED Diagnoses Final diagnoses:  None    Rx / DC Orders ED Discharge Orders     None         Juanetta Nordmann, PA 06/19/23 1301    Lind Repine, MD 06/21/23 [phone]

## 2023-09-17 ENCOUNTER — Ambulatory Visit (INDEPENDENT_AMBULATORY_CARE_PROVIDER_SITE_OTHER)

## 2023-09-17 ENCOUNTER — Encounter (HOSPITAL_COMMUNITY): Payer: Self-pay | Admitting: Emergency Medicine

## 2023-09-17 ENCOUNTER — Other Ambulatory Visit: Payer: Self-pay

## 2023-09-17 ENCOUNTER — Ambulatory Visit (HOSPITAL_COMMUNITY)
Admission: EM | Admit: 2023-09-17 | Discharge: 2023-09-17 | Disposition: A | Discharge status: Home / Self Care | Attending: Internal Medicine | Admitting: Internal Medicine

## 2023-09-17 DIAGNOSIS — S6992XA Unspecified injury of left wrist, hand and finger(s), initial encounter: Secondary | ICD-10-CM
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
## 2023-09-17 NOTE — Discharge Instructions (Signed)
 Follow up with orthopedic doctor if you dont improve or your pain persists after wearing the splint for 7 days.

## 2023-09-17 NOTE — ED Triage Notes (Signed)
 Patient broke wrist 3 months ago.  Patient reports falling on left wrist 3 days ago and is having pain.  Patient states it is the same pain as last time.  Patient points to radial aspect of left wrist  Tripped over a bump in sidewalk  Patient is able to move all fingers, but movement of thumb is painful.  Left radial pulse is 2 +.  Brisk capillary refill  Has not  taken any medications for this.

## 2023-09-17 NOTE — ED Provider Notes (Signed)
 [facility]    CSN: [id number] Arrival date & time: 09/17/23  1116      History   Chief Complaint Chief Complaint  Patient presents with   Wrist Pain    HPI Edwin Macdonald is a 23 y.o. male presents with due to having L wrist pain after falling when he tripped over a bump on the sidewalk 3 days ago. He broke this same wrist 3 months ago. The pain feels the same as then.     Past Medical History:  Diagnosis Date   ASD (atrial septal defect)    Seasonal allergies     Patient Active Problem List   Diagnosis Date Noted   Elevated serum creatinine 11/20/2022   Sleep concern 02/24/2022   Foreign body (FB) in soft tissue 09/04/2020   Rash 08/01/2019   Angioedema 08/01/2018   Foot pain, left 07/04/2018   Keloid of skin 02/27/2017   Depression 03/29/2016   Chest pain 12/31/2015   Learning disability 04/13/2014   ASD (atrial septal defect) 06/20/2010   SPEECH DELAY 03/15/2006   VISUAL DISTURBANCE NOS 03/15/2006   Seasonal allergies 03/15/2006    Past Surgical History:  Procedure Laterality Date   CARDIAC SURGERY     EYE SURGERY         Home Medications    Prior to Admission medications   Medication Sig Start Date End Date Taking? Authorizing Provider  cetirizine  (ZYRTEC ) 10 MG tablet TAKE 1 TABLET(10 MG) BY MOUTH DAILY 04/13/22   Carlyon Richerd PARAS, DO  fluticasone  (FLONASE ) 50 MCG/ACT nasal spray Place 2 sprays into both nostrils daily. 04/17/23   Bryan Bianchi, MD  ibuprofen  (ADVIL ) 600 MG tablet Take 1 tablet (600 mg total) by mouth every 6 (six) hours as needed. 01/15/23   Griselda Norris, MD  montelukast  (SINGULAIR ) 10 MG tablet TAKE 1 TABLET(10 MG) BY MOUTH AT BEDTIME 03/15/23   Baloch, Mahnoor, MD  triamcinolone  ointment (KENALOG ) 0.5 % Apply 1 Application topically 2 (two) times daily. For moderate to severe eczema.  Do not use for more than 1 week at a time. 04/13/22   Carlyon Richerd PARAS, DO    Family History Family History  Problem Relation Age of  Onset   Healthy Mother    Healthy Father     Social History Social History   Tobacco Use   Smoking status: Never    Passive exposure: Never   Smokeless tobacco: Never  Vaping Use   Vaping status: Never Used  Substance Use Topics   Alcohol use: Never   Drug use: Never     Allergies   Patient has no known allergies.   Review of Systems Review of Systems As noted in HPI  Physical Exam Triage Vital Signs ED Triage Vitals  Encounter Vitals Group     BP 09/17/23 1142 114/65     Girls Systolic BP Percentile --      Girls Diastolic BP Percentile --      Boys Systolic BP Percentile --      Boys Diastolic BP Percentile --      Pulse Rate 09/17/23 1142 78     Resp 09/17/23 1142 18     Temp 09/17/23 1142 98.2 F (36.8 C)     Temp src --      SpO2 09/17/23 1142 97 %     Weight --      Height --      Head Circumference --      Peak Flow --  Pain Score 09/17/23 1139 8     Pain Loc --      Pain Education --      Exclude from Growth Chart --    No data found.  Updated Vital Signs BP 114/65 (BP Location: Left Arm)   Pulse 78   Temp 98.2 F (36.8 C)   Resp 18   SpO2 97%   Visual Acuity Right Eye Distance:   Left Eye Distance:   Bilateral Distance:    Right Eye Near:   Left Eye Near:    Bilateral Near:     Physical Exam Vitals and nursing note reviewed.  Constitutional:      General: He is not in acute distress.    Appearance: He is normal weight. He is not toxic-appearing.  HENT:     Right Ear: External ear normal.     Left Ear: External ear normal.  Eyes:     General: No scleral icterus.    Conjunctiva/sclera: Conjunctivae normal.  Cardiovascular:     Pulses: Normal pulses.  Musculoskeletal:        General: Normal range of motion.     Cervical back: Neck supple.     Comments: L WRIST- no swelling or ecchymosis or deformity noted. Has point tenderness on Capitate bone.   Skin:    General: Skin is warm and dry.     Capillary Refill: Capillary  refill takes less than 2 seconds.  Neurological:     Mental Status: He is alert and oriented to person, place, and time.     Sensory: No sensory deficit.     Motor: No weakness.     Gait: Gait normal.  Psychiatric:        Mood and Affect: Mood normal.        Behavior: Behavior normal.        Thought Content: Thought content normal.        Judgment: Judgment normal.      UC Treatments / Results  Labs (all labs ordered are listed, but only abnormal results are displayed) Labs Reviewed - No data to display  EKG   Radiology DG Wrist Complete Left Result Date: 09/17/2023 CLINICAL DATA:  Left wrist injury, pain EXAM: LEFT WRIST - COMPLETE 3+ VIEW COMPARISON:  None Available. FINDINGS: There is no evidence of fracture or dislocation. There is no evidence of arthropathy or other focal bone abnormality. Soft tissues are unremarkable. IMPRESSION: No acute osseous findings. Electronically Signed   By: Michaeline Blanch M.D.   On: 09/17/2023 12:00    Procedures Procedures (including critical care time)  Medications Ordered in UC Medications - No data to display  Initial Impression / Assessment and Plan / UC Course  I have reviewed the triage vital signs and the nursing notes.  Pertinent  imaging results that were available during my care of the patient were reviewed by me and considered in my medical decision making (see chart for details).  L wrist strain  He was placed on a wrist splint to wear for 5-7 days and FU with ortho if pain persists.    Final Clinical Impressions(s) / UC Diagnoses   Final diagnoses:  Injury of left wrist, initial encounter     Discharge Instructions      Follow up with orthopedic doctor if you dont improve or your pain persists after wearing the splint for 7 days.      ED Prescriptions   None    PDMP not reviewed this encounter.  Lindi Carter, PA-C 09/17/23 1232

## 2023-11-05 ENCOUNTER — Other Ambulatory Visit: Payer: Self-pay

## 2023-11-06 MED ORDER — MONTELUKAST SODIUM 10 MG PO TABS: 10.0000 mg | ORAL_TABLET | Freq: Every day | ORAL | 5 refills | Status: AC

## 2023-11-20 IMAGING — DX DG CHEST 1V PORT
1 series · 1 of 1 positions shown · non-contrast
Comparison: 05/31/2020

CLINICAL DATA: Chest pain.  Fever.

EXAM:
PORTABLE CHEST 1 VIEW

[chest]
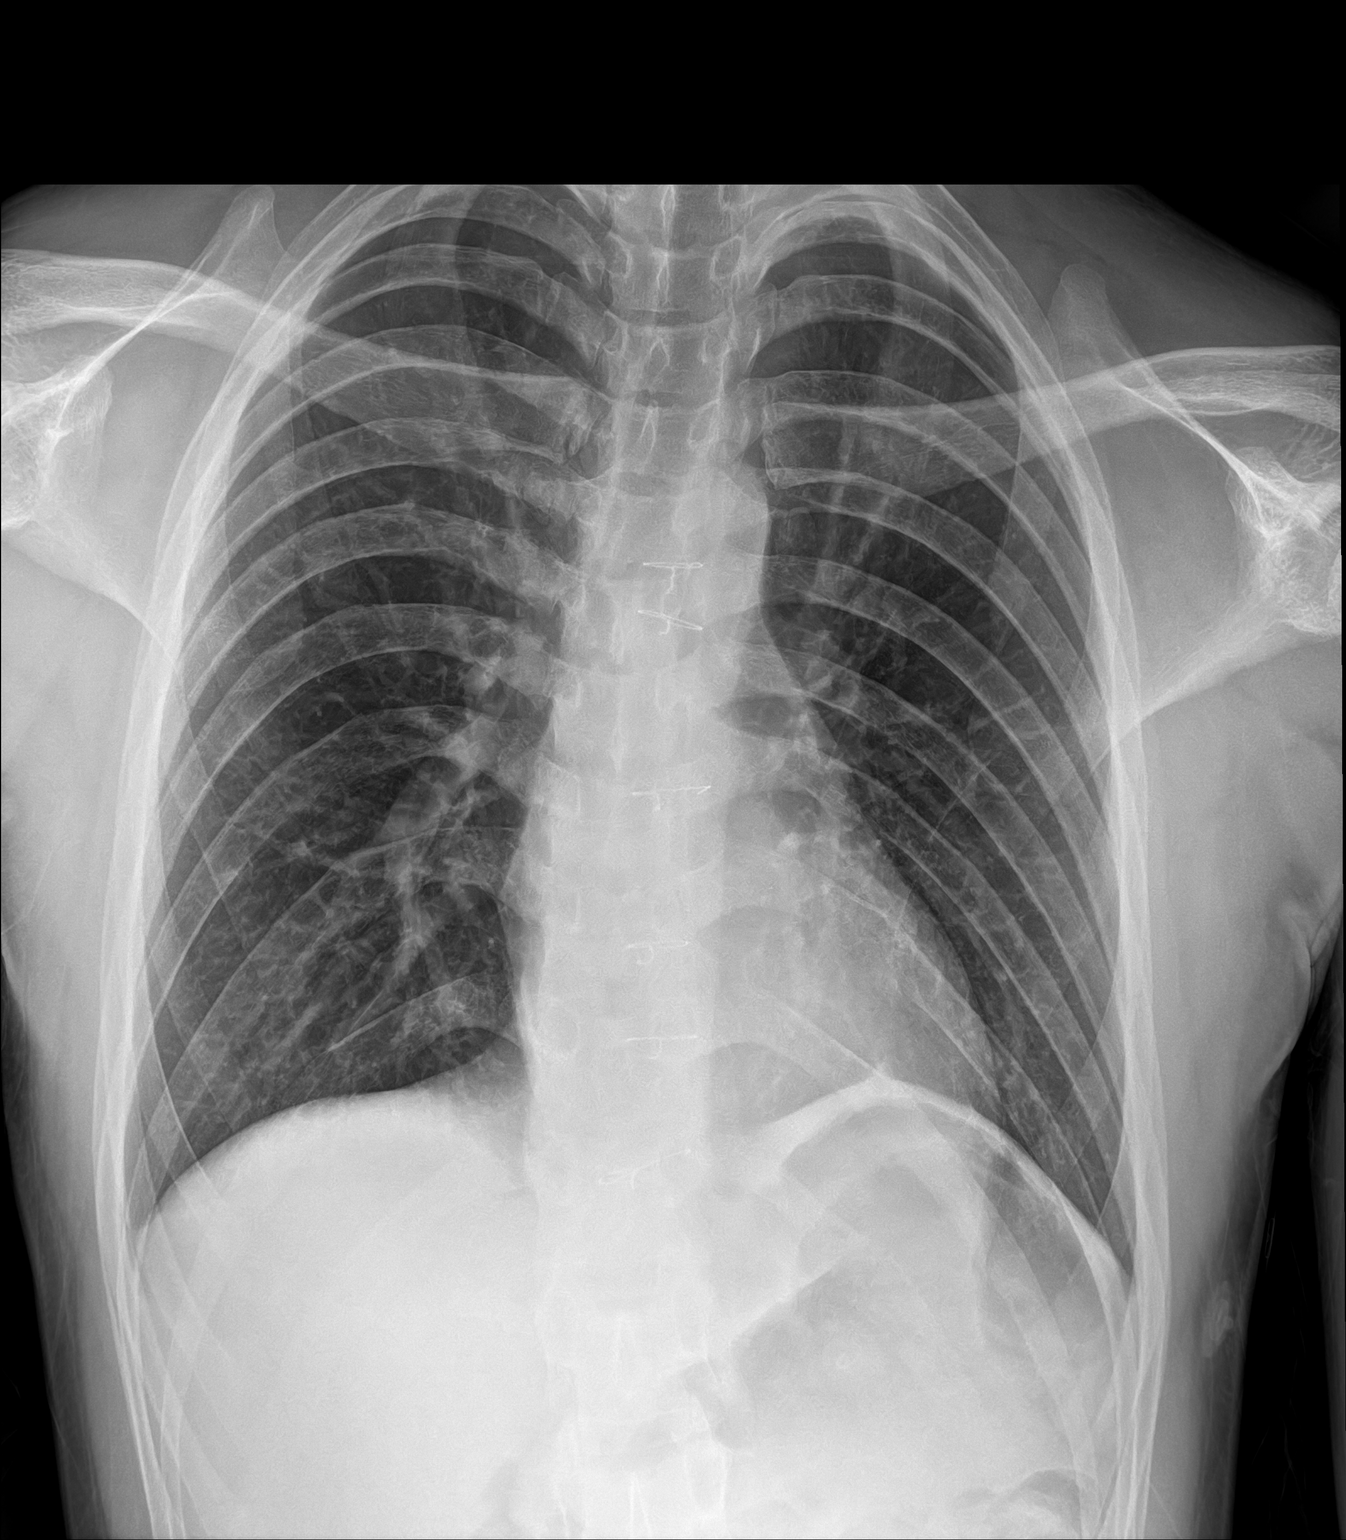

[1 of 1 positions shown; findings below may reference images not displayed]

FINDINGS: Post remote median sternotomy. The heart is normal in size. Normal
mediastinal contours. No focal airspace disease. No pleural
effusion, pulmonary edema, or pneumothorax. No acute osseous
abnormalities are seen.
IMPRESSION: No acute chest findings.

## 2023-12-02 ENCOUNTER — Ambulatory Visit (HOSPITAL_COMMUNITY)
Admission: EM | Admit: 2023-12-02 | Discharge: 2023-12-02 | Disposition: A | Discharge status: Home / Self Care | Attending: Emergency Medicine | Admitting: Emergency Medicine

## 2023-12-02 ENCOUNTER — Encounter (HOSPITAL_COMMUNITY): Payer: Self-pay

## 2023-12-02 ENCOUNTER — Ambulatory Visit (INDEPENDENT_AMBULATORY_CARE_PROVIDER_SITE_OTHER)

## 2023-12-02 DIAGNOSIS — M25531 Pain in right wrist: Secondary | ICD-10-CM | POA: Diagnosis not present

## 2023-12-02 MED ORDER — IBUPROFEN 800 MG PO TABS
800.0000 mg | ORAL_TABLET | Freq: Three times a day (TID) | ORAL | 0 refills | Status: AC
Start: 2023-12-02 — End: ?

## 2023-12-02 NOTE — Discharge Instructions (Addendum)
 Rest - try to avoid heavy lifting and high impact activity Ice - apply for 20 minutes a few times daily (wrapped in towel) Compression - use wrist brace for support  Elevation - prop up on a pillow  Ibuprofen  every 6 hours as needed for pain  Please follow up with your orthopedic specialist. Call to make an appointment.

## 2023-12-02 NOTE — ED Triage Notes (Addendum)
 Patient reports that he  fell on his right wrist 2 months ago. Patient c/o right wrist pain from picking up heavy boxes at work. Patient states he has pain when he picks up his phone with his right hand. Increased pain with movement.  No swelling or deformity noted.  Patient state she has been taking Tylenol  for for his pain.

## 2023-12-02 NOTE — ED Provider Notes (Signed)
 [facility]    CSN: [id number] Arrival date & time: 12/02/23  1621      History   Chief Complaint Chief Complaint  Patient presents with   Wrist Pain    HPI Edwin Macdonald is a 23 y.o. male.  RIGHT wrist pain for a couple days. Rates pain 9/10 today. Worse with lifting heavy boxes and certain movements. No direct injury or trauma recently.   He initially stated he fell on this wrist 2 months ago, however on chart review that was actually his LEFT wrist.   Took a dose of ibuprofen  200 mg yesterday once  LEFT distal radius fracture December 2024 RIGHT hand fracture in December 2023  Past Medical History:  Diagnosis Date   ASD (atrial septal defect)    Seasonal allergies     Patient Active Problem List   Diagnosis Date Noted   Elevated serum creatinine 11/20/2022   Sleep concern 02/24/2022   Foreign body (FB) in soft tissue 09/04/2020   Rash 08/01/2019   Angioedema 08/01/2018   Foot pain, left 07/04/2018   Keloid of skin 02/27/2017   Depression 03/29/2016   Chest pain 12/31/2015   Learning disability 04/13/2014   ASD (atrial septal defect) 06/20/2010   SPEECH DELAY 03/15/2006   VISUAL DISTURBANCE NOS 03/15/2006   Seasonal allergies 03/15/2006    Past Surgical History:  Procedure Laterality Date   CARDIAC SURGERY     EYE SURGERY         Home Medications    Prior to Admission medications   Medication Sig Start Date End Date Taking? Authorizing Provider  ibuprofen  (ADVIL ) 800 MG tablet Take 1 tablet (800 mg total) by mouth 3 (three) times daily. 12/02/23  Yes Lean Fayson, Asberry, PA-C  cetirizine  (ZYRTEC ) 10 MG tablet TAKE 1 TABLET(10 MG) BY MOUTH DAILY 04/13/22   Carlyon Richerd PARAS, DO  fluticasone  (FLONASE ) 50 MCG/ACT nasal spray Place 2 sprays into both nostrils daily. 04/17/23   Bryan Bianchi, MD  montelukast  (SINGULAIR ) 10 MG tablet Take 1 tablet (10 mg total) by mouth at bedtime. 11/06/23   Baloch, Mahnoor, MD  triamcinolone  ointment (KENALOG )  0.5 % Apply 1 Application topically 2 (two) times daily. For moderate to severe eczema.  Do not use for more than 1 week at a time. 04/13/22   Carlyon Richerd PARAS, DO    Family History Family History  Problem Relation Age of Onset   Healthy Mother    Healthy Father     Social History Social History   Tobacco Use   Smoking status: Never    Passive exposure: Never   Smokeless tobacco: Never  Vaping Use   Vaping status: Never Used  Substance Use Topics   Alcohol use: Never   Drug use: Never     Allergies   Patient has no known allergies.   Review of Systems Review of Systems  As per HPI  Physical Exam Triage Vital Signs ED Triage Vitals [12/02/23 1902]  Encounter Vitals Group     BP (!) 112/57     Girls Systolic BP Percentile      Girls Diastolic BP Percentile      Boys Systolic BP Percentile      Boys Diastolic BP Percentile      Pulse Rate 66     Resp 14     Temp 98.8 F (37.1 C)     Temp Source Oral     SpO2      Weight  Height      Head Circumference      Peak Flow      Pain Score 9     Pain Loc      Pain Education      Exclude from Growth Chart    No data found.  Updated Vital Signs BP (!) 112/57 (BP Location: Right Arm)   Pulse 66   Temp 98.8 F (37.1 C) (Oral)   Resp 14    Physical Exam Vitals and nursing note reviewed.  Constitutional:      General: He is not in acute distress. HENT:     Mouth/Throat:     Pharynx: Oropharynx is clear.  Cardiovascular:     Rate and Rhythm: Normal rate and regular rhythm.     Pulses: Normal pulses.  Pulmonary:     Effort: Pulmonary effort is normal.  Musculoskeletal:        General: No swelling, deformity or signs of injury.     Right wrist: Tenderness (movement) present. No swelling, deformity or snuff box tenderness. Normal range of motion. Normal pulse.     Comments: Movement pain right wrist. No bony tenderness. No obvious deformity or swelling. Grip strength 4/5, compared to left 5/5. Distal  sensation intact. Cap refill fingers < 2 seconds. Radial pulse 2+  Skin:    General: Skin is warm and dry.     Capillary Refill: Capillary refill takes less than 2 seconds.  Neurological:     Mental Status: He is alert and oriented to person, place, and time.     UC Treatments / Results  Labs (all labs ordered are listed, but only abnormal results are displayed) Labs Reviewed - No data to display  EKG   Radiology DG Wrist Complete Right Result Date: 12/02/2023 EXAM: 3 OR MORE VIEW(S) XRAY OF THE RIGHT WRIST 12/02/2023 07:36:11 PM COMPARISON: 12/25/2021 CLINICAL HISTORY: wrist pain for a while. 5th finger fracture 2 years ago. no recent injury FINDINGS: BONES AND JOINTS: Interval healing of the previously noted fracture at the base of the 5th metacarpal. Lunotriquetral coalition anatomic variant. No joint dislocation. SOFT TISSUES: The soft tissues are unremarkable. IMPRESSION: 1. Interval healing of the prior fracture at the base of the 5th metacarpal. 2. Lunotriquetral coalition, an anatomic variant. Electronically signed by: Dayne Hassell MD 12/02/2023 07:45 PM EST RP Workstation: [id number]    Procedures Procedures (including critical care time)  Medications Ordered in UC Medications - No data to display  Initial Impression / Assessment and Plan / UC Course  I have reviewed the triage vital signs and the nursing notes.  Pertinent labs & imaging results that were available during my care of the patient were reviewed by me and considered in my medical decision making (see chart for details).  RIGHT wrist xray negative. Images independently reviewed by me, agree with radiology interpretation. Discussed soft tissue injury, tendonitis suspected. Brace provided. RICE therapy at home and pain control with ibuprofen . I have advised he follow with his orthopedic specialist if symptoms persist despite these therapies. Patient is agreeable with plan, no questions   Final Clinical  Impressions(s) / UC Diagnoses   Final diagnoses:  Right wrist pain     Discharge Instructions      Rest - try to avoid heavy lifting and high impact activity Ice - apply for 20 minutes a few times daily (wrapped in towel) Compression - use wrist brace for support  Elevation - prop up on a pillow  Ibuprofen  every 6 hours  as needed for pain  Please follow up with your orthopedic specialist. Call to make an appointment.      ED Prescriptions     Medication Sig Dispense Auth. Provider   ibuprofen  (ADVIL ) 800 MG tablet Take 1 tablet (800 mg total) by mouth 3 (three) times daily. 21 tablet Salim Forero, Asberry, PA-C      PDMP not reviewed this encounter.   Chosen Garron, PA-C 12/02/23 2027

## 2024-01-23 ENCOUNTER — Encounter (HOSPITAL_COMMUNITY): Payer: Self-pay

## 2024-01-23 ENCOUNTER — Other Ambulatory Visit: Payer: Self-pay

## 2024-01-23 ENCOUNTER — Emergency Department (HOSPITAL_COMMUNITY)
Admission: EM | Admit: 2024-01-23 | Discharge: 2024-01-23 | Disposition: A | Discharge status: Home / Self Care | Attending: Emergency Medicine | Admitting: Emergency Medicine

## 2024-01-23 DIAGNOSIS — R519 Headache, unspecified: Secondary | ICD-10-CM | POA: Diagnosis present

## 2024-01-23 DIAGNOSIS — R6883 Chills (without fever): Secondary | ICD-10-CM | POA: Insufficient documentation

## 2024-01-23 DIAGNOSIS — R110 Nausea: Secondary | ICD-10-CM | POA: Diagnosis not present

## 2024-01-23 DIAGNOSIS — R6889 Other general symptoms and signs: Secondary | ICD-10-CM

## 2024-01-23 MED ORDER — ONDANSETRON 4 MG PO TBDP: 4.0000 mg | ORAL_TABLET | Freq: Three times a day (TID) | ORAL | 0 refills | Status: AC | PRN

## 2024-01-23 MED ORDER — OSELTAMIVIR PHOSPHATE 75 MG PO CAPS: 75.0000 mg | ORAL_CAPSULE | Freq: Two times a day (BID) | ORAL | 0 refills | Status: AC

## 2024-01-23 NOTE — Discharge Instructions (Signed)
 It was a pleasure taking care of you today.  As discussed, you likely have a viral infection causing your symptoms which could possibly be the flu.  You were offered further workup however, decided to be discharged with symptomatic treatment given long wait times.  I am sending you home with Tamiflu  and Zofran  as needed for nausea.  Please follow-up with PCP if symptoms do not improve.  You may take over-the-counter ibuprofen  or Tylenol  as needed for pain.  Return to the ER for any worsening symptoms.

## 2024-01-23 NOTE — ED Triage Notes (Signed)
 Complains of sudden headache eye pressure chills that started this morning.  But then when he got in the truck with ems he reported all this has been going on x 1 week.    BP 122/70 HR 60 97% CBG 98

## 2024-01-23 NOTE — ED Provider Notes (Signed)
 " [facility] Provider Note   CSN: [id number] Arrival date & time: 01/23/24  1024     Patient presents with: URI   Edwin Macdonald is a 24 y.o. male with no significant past medical history who presents to the ED due to headache, chills, and nausea that started this morning around 5 AM.  Notes his coworkers have been sick with flu like symptoms.  Denies cough, fever, vomiting, and diarrhea.  Denies chest pain and shortness of breath.  No abdominal pain.  He has not tried anything for his symptoms.  No history of asthma.  History obtained from patient and past medical records. No interpreter used during encounter.       Prior to Admission medications  Medication Sig Start Date End Date Taking? Authorizing Provider  ondansetron  (ZOFRAN -ODT) 4 MG disintegrating tablet Take 1 tablet (4 mg total) by mouth every 8 (eight) hours as needed for nausea or vomiting. 01/23/24  Yes Adele Milson, Aleck BROCKS, PA-C  oseltamivir  (TAMIFLU ) 75 MG capsule Take 1 capsule (75 mg total) by mouth every 12 (twelve) hours. 01/23/24  Yes Verlan Grotz, Aleck C, PA-C  cetirizine  (ZYRTEC ) 10 MG tablet TAKE 1 TABLET(10 MG) BY MOUTH DAILY 04/13/22   Carlyon Richerd PARAS, DO  fluticasone  (FLONASE ) 50 MCG/ACT nasal spray Place 2 sprays into both nostrils daily. 04/17/23   Bryan Bianchi, MD  ibuprofen  (ADVIL ) 800 MG tablet Take 1 tablet (800 mg total) by mouth 3 (three) times daily. 12/02/23   Rising, Asberry, PA-C  montelukast  (SINGULAIR ) 10 MG tablet Take 1 tablet (10 mg total) by mouth at bedtime. 11/06/23   Baloch, Mahnoor, MD  triamcinolone  ointment (KENALOG ) 0.5 % Apply 1 Application topically 2 (two) times daily. For moderate to severe eczema.  Do not use for more than 1 week at a time. 04/13/22   Paige, Victoria J, DO    Allergies: Patient has no known allergies.    Review of Systems  Constitutional:  Positive for chills. Negative for fever.  Respiratory:  Negative for cough and shortness  of breath.   Cardiovascular:  Negative for chest pain.  Gastrointestinal:  Positive for nausea. Negative for abdominal pain, diarrhea and vomiting.  Neurological:  Positive for headaches.    Updated Vital Signs BP 115/73 (BP Location: Left Arm)   Pulse 61   Temp 98.2 F (36.8 C) (Oral)   Resp 18   Ht 5' 9 (1.753 m)   Wt 68 kg   SpO2 97%   BMI 22.14 kg/m   Physical Exam Vitals and nursing note reviewed.  Constitutional:      General: He is not in acute distress.    Appearance: He is not ill-appearing.  HENT:     Head: Normocephalic.  Eyes:     Pupils: Pupils are equal, round, and reactive to light.  Cardiovascular:     Rate and Rhythm: Normal rate and regular rhythm.     Pulses: Normal pulses.     Heart sounds: Normal heart sounds. No murmur heard.    No friction rub. No gallop.  Pulmonary:     Effort: Pulmonary effort is normal.     Breath sounds: Normal breath sounds.  Abdominal:     General: Abdomen is flat. There is no distension.     Palpations: Abdomen is soft.     Tenderness: There is no abdominal tenderness. There is no guarding or rebound.  Musculoskeletal:        General: Normal range of motion.  Cervical back: Neck supple.  Skin:    General: Skin is warm and dry.  Neurological:     General: No focal deficit present.     Mental Status: He is alert.  Psychiatric:        Mood and Affect: Mood normal.        Behavior: Behavior normal.     (all labs ordered are listed, but only abnormal results are displayed) Labs Reviewed - No data to display  EKG: None  Radiology: No results found.   Procedures   Medications Ordered in the ED - No data to display                                  Medical Decision Making  24 year old male presents to the ED due to headache, chills, and nausea that started this morning around 5 AM.  Has been around coworkers with flulike symptoms.  Denies chest pain and shortness of breath.  No history of asthma.  Upon  arrival, vitals all within normal limits.  Patient is afebrile, not tachycardic or hypoxic.  Patient well-appearing on exam.  Lungs clear to auscultation bilaterally.  Low suspicion for pneumonia.  No meningismus to suggest meningitis.  Abdomen soft, nondistended, nontender.  Low suspicion for acute abdomen. Shared decision making in regards to further workup of symptoms versus discharge home with symptomatic treatment given we are currently at a 20-hour wait in the ED.  Patient prefers to be discharged home with symptomatic treatment for suspected flu. Unfortunately, we are low on viral testing, so unable to officially test for flu/COVID. Patient requesting a prescription for Tamiflu . He was made aware of possible side effects. Will also send patient home with Zofran .  Over-the-counter ibuprofen  or Tylenol  as needed for pain.  Patient stable for discharge. Strict ED precautions discussed with patient. Patient states understanding and agrees to plan. Patient discharged home in no acute distress and stable vitals  Has PCP Hx ASD    Final diagnoses:  Flu-like symptoms    ED Discharge Orders          Ordered    oseltamivir  (TAMIFLU ) 75 MG capsule  Every 12 hours        01/23/24 1054    ondansetron  (ZOFRAN -ODT) 4 MG disintegrating tablet  Every 8 hours PRN        01/23/24 1054               Rox Mcgriff C, PA-C 01/23/24 1057    Mannie Pac T, DO 01/24/24 [phone]  "

## 2024-01-24 ENCOUNTER — Ambulatory Visit (HOSPITAL_COMMUNITY)
